# Patient Record
Sex: Male | Born: 1975 | Hispanic: Yes | Marital: Single | Smoking: Current every day smoker
Health system: Southern US, Community
[De-identification: ages and names within clinical notes are randomized; demographics above are authoritative.]

## PROBLEM LIST (undated history)

## (undated) DIAGNOSIS — I10 Essential (primary) hypertension: Secondary | ICD-10-CM

---

## 2015-09-10 ENCOUNTER — Emergency Department
Admission: EM | Admit: 2015-09-10 | Discharge: 2015-09-10 | Disposition: A | Discharge status: Home / Self Care | Payer: Self-pay | Attending: Emergency Medicine | Admitting: Emergency Medicine

## 2015-09-10 ENCOUNTER — Encounter: Payer: Self-pay | Admitting: Emergency Medicine

## 2015-09-10 ENCOUNTER — Emergency Department: Payer: Self-pay

## 2015-09-10 DIAGNOSIS — M5432 Sciatica, left side: Secondary | ICD-10-CM

## 2015-09-10 DIAGNOSIS — F17200 Nicotine dependence, unspecified, uncomplicated: Secondary | ICD-10-CM | POA: Insufficient documentation

## 2015-09-10 DIAGNOSIS — M5442 Lumbago with sciatica, left side: Secondary | ICD-10-CM | POA: Insufficient documentation

## 2015-09-10 MED ORDER — HYDROCODONE-ACETAMINOPHEN 5-325 MG PO TABS: 1.0000 | ORAL_TABLET | ORAL | 0 refills | Status: DC | PRN

## 2015-09-10 MED ORDER — IBUPROFEN 800 MG PO TABS: 800.0000 mg | ORAL_TABLET | Freq: Three times a day (TID) | ORAL | 0 refills | Status: DC | PRN

## 2015-09-10 MED ORDER — CYCLOBENZAPRINE HCL 10 MG PO TABS: 10.0000 mg | ORAL_TABLET | Freq: Three times a day (TID) | ORAL | 0 refills | Status: DC | PRN

## 2015-09-10 NOTE — ED Triage Notes (Signed)
Pt c/o lower back pain that is also down left leg.  Denies loss bowel or bladder. Pain worse when standing and back is straight. C/O pain down left leg from back.

## 2015-09-10 NOTE — ED Provider Notes (Signed)
[facility] Emergency Department Provider Note  ____________________________________________  Time seen: Approximately 4:34 PM  I have reviewed the triage vital signs and the nursing notes.   HISTORY  Chief Complaint Back Pain    HPI Kurt Colon is a 40 y.o. male presents for evaluation of severe low back pain radiating down his left leg times one day. 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 Patient's concern is having a similar episode to his left leg.   History reviewed. No pertinent past medical history.  There are no active problems to display for this patient.   History reviewed. No pertinent surgical history.    Allergies Review of patient's allergies indicates no known allergies.  History reviewed. No pertinent family history.  Social History Social History  Substance Use Topics  . Smoking status: Current Every Day Smoker  . Smokeless tobacco: Not on file  . Alcohol use Not on file    Review of Systems Constitutional: No fever/chills Cardiovascular: Denies chest pain. Respiratory: Denies shortness of breath. Gastrointestinal: No abdominal pain.  No nausea, no vomiting.  No diarrhea.  No constipation. Genitourinary: Negative for dysuria. Musculoskeletal: Positive for left leg pain and low back pain. Skin: Negative for rash. Neurological: Negative for headaches, focal weakness or numbness.  10-point ROS otherwise negative.  ____________________________________________   PHYSICAL EXAM:  VITAL SIGNS: ED Triage Vitals  Enc Vitals Group     BP 09/10/15 1512 (!) 173/94     Pulse Rate 09/10/15 1512 (!) 111     Resp 09/10/15 1512 18     Temp 09/10/15 1512 97.9 F (36.6 C)     Temp Source 09/10/15 1512 Oral     SpO2 09/10/15 1512 96 %     Weight 09/10/15 1512 165 lb (74.8 kg)     Height 09/10/15 1512 5\' 7"  (1.702 m)     Head Circumference --    Peak Flow --      Pain Score 09/10/15 1513 8     Pain Loc --      Pain Edu? --      Excl. in GC? --     Constitutional: Alert and oriented. Well appearing and in no acute distress. Neck: No stridor. , Full range of motion nontender.  Cardiovascular: Normal rate, regular rhythm. Grossly normal heart sounds.  Good peripheral circulation. Respiratory: Normal respiratory effort.  No retractions. Lungs CTAB. Gastrointestinal: Soft and nontender. No distention. No CVA tenderness. Musculoskeletal: No spinal tenderness positive straight leg raise to the left leg. Distally neurovascularly intact with point tenderness noted to the left paraspinal lumbar region. Neurologic:  Normal speech and language. No gross focal neurologic deficits are appreciated. No gait instability. Skin:  Skin is warm, dry and intact. No rash noted. Psychiatric: Mood and affect are normal. Speech and behavior are normal.  ____________________________________________   LABS (all labs ordered are listed, but only abnormal results are displayed)  Labs Reviewed - No data to display ____________________________________________  EKG   ____________________________________________  RADIOLOGY  IMPRESSION: Left foraminal protrusion at L3-4 appears to impinge on the exiting left L3 root. Disc and endplate spur at L5-S1 cause moderate to moderately severe bilateral foraminal narrowing. Disc contacts the S1 roots in the lateral recesses although the roots do not appear compressed. Atherosclerosis. ____________________________________________   PROCEDURES  Procedure(s) performed: None  Critical Care performed: No  ____________________________________________   INITIAL IMPRESSION / ASSESSMENT AND PLAN /  ED COURSE  Pertinent labs & imaging results that were available during my care of the patient were reviewed by me and considered in my medical decision making (see chart for details).  Acute exacerbation of  sciatica left leg. Rx given for Flexeril 10 mg 3 times a day, ibuprofen 3 times a day, Norco 5/325. Patient follow-up PCP or return ER with any worsening symptomology.  Clinical Course    ____________________________________________   FINAL CLINICAL IMPRESSION(S) / ED DIAGNOSES  Final diagnoses:  Sciatica of left side     This chart was dictated using voice recognition software/Dragon. Despite best efforts to proofread, errors can occur which can change the meaning. Any change was purely unintentional.    Evangeline Dakin, PA-C 09/10/15 1731    Sharman Cheek, MD 09/13/15 [phone]

## 2018-04-14 ENCOUNTER — Encounter: Payer: Self-pay | Admitting: Emergency Medicine

## 2018-04-14 ENCOUNTER — Emergency Department: Payer: Self-pay

## 2018-04-14 ENCOUNTER — Emergency Department
Admission: EM | Admit: 2018-04-14 | Discharge: 2018-04-14 | Disposition: A | Discharge status: Home / Self Care | Payer: Self-pay | Attending: Emergency Medicine | Admitting: Emergency Medicine

## 2018-04-14 ENCOUNTER — Other Ambulatory Visit: Payer: Self-pay

## 2018-04-14 DIAGNOSIS — S93402A Sprain of unspecified ligament of left ankle, initial encounter: Secondary | ICD-10-CM | POA: Insufficient documentation

## 2018-04-14 DIAGNOSIS — M25472 Effusion, left ankle: Secondary | ICD-10-CM

## 2018-04-14 DIAGNOSIS — Y9301 Activity, walking, marching and hiking: Secondary | ICD-10-CM | POA: Insufficient documentation

## 2018-04-14 DIAGNOSIS — Y999 Unspecified external cause status: Secondary | ICD-10-CM | POA: Insufficient documentation

## 2018-04-14 DIAGNOSIS — I10 Essential (primary) hypertension: Secondary | ICD-10-CM | POA: Insufficient documentation

## 2018-04-14 DIAGNOSIS — W0110XA Fall on same level from slipping, tripping and stumbling with subsequent striking against unspecified object, initial encounter: Secondary | ICD-10-CM | POA: Insufficient documentation

## 2018-04-14 DIAGNOSIS — F17200 Nicotine dependence, unspecified, uncomplicated: Secondary | ICD-10-CM | POA: Insufficient documentation

## 2018-04-14 DIAGNOSIS — Y929 Unspecified place or not applicable: Secondary | ICD-10-CM | POA: Insufficient documentation

## 2018-04-14 HISTORY — DX: Essential (primary) hypertension: I10

## 2018-04-14 MED ORDER — MELOXICAM 15 MG PO TABS: 15.0000 mg | ORAL_TABLET | Freq: Every day | ORAL | 0 refills | Status: AC

## 2018-04-14 NOTE — ED Triage Notes (Signed)
Pt c/o LFT ankle pain after injury xfew days ago. + swelling, no deformity noted
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
## 2018-04-14 NOTE — ED Notes (Signed)
See triage note  Stats  He twisted his left ankle last Thursday  Has been able to bear some wt  Positive swelling noted  No deformity noted  Good pulses

## 2019-07-20 ENCOUNTER — Emergency Department: Payer: Self-pay

## 2019-07-20 ENCOUNTER — Emergency Department
Admission: EM | Admit: 2019-07-20 | Discharge: 2019-07-20 | Disposition: A | Discharge status: Home / Self Care | Payer: Self-pay | Attending: Emergency Medicine | Admitting: Emergency Medicine

## 2019-07-20 ENCOUNTER — Other Ambulatory Visit: Payer: Self-pay

## 2019-07-20 DIAGNOSIS — S01411A Laceration without foreign body of right cheek and temporomandibular area, initial encounter: Secondary | ICD-10-CM | POA: Insufficient documentation

## 2019-07-20 DIAGNOSIS — Y9389 Activity, other specified: Secondary | ICD-10-CM | POA: Insufficient documentation

## 2019-07-20 DIAGNOSIS — S60511A Abrasion of right hand, initial encounter: Secondary | ICD-10-CM | POA: Insufficient documentation

## 2019-07-20 DIAGNOSIS — S0181XA Laceration without foreign body of other part of head, initial encounter: Secondary | ICD-10-CM | POA: Insufficient documentation

## 2019-07-20 DIAGNOSIS — Z23 Encounter for immunization: Secondary | ICD-10-CM | POA: Insufficient documentation

## 2019-07-20 DIAGNOSIS — F17200 Nicotine dependence, unspecified, uncomplicated: Secondary | ICD-10-CM | POA: Insufficient documentation

## 2019-07-20 DIAGNOSIS — T07XXXA Unspecified multiple injuries, initial encounter: Secondary | ICD-10-CM

## 2019-07-20 DIAGNOSIS — S80211A Abrasion, right knee, initial encounter: Secondary | ICD-10-CM | POA: Insufficient documentation

## 2019-07-20 DIAGNOSIS — Y999 Unspecified external cause status: Secondary | ICD-10-CM | POA: Insufficient documentation

## 2019-07-20 DIAGNOSIS — S80212A Abrasion, left knee, initial encounter: Secondary | ICD-10-CM | POA: Insufficient documentation

## 2019-07-20 DIAGNOSIS — S60512A Abrasion of left hand, initial encounter: Secondary | ICD-10-CM | POA: Insufficient documentation

## 2019-07-20 DIAGNOSIS — Y92524 Gas station as the place of occurrence of the external cause: Secondary | ICD-10-CM | POA: Insufficient documentation

## 2019-07-20 MED ORDER — CYCLOBENZAPRINE HCL 5 MG PO TABS: 5.0000 mg | ORAL_TABLET | Freq: Three times a day (TID) | ORAL | 0 refills | Status: AC | PRN

## 2019-07-20 MED ORDER — AMOXICILLIN-POT CLAVULANATE 875-125 MG PO TABS
1.0000 | ORAL_TABLET | Freq: Once | ORAL | Status: AC
  Administered 2019-07-20: 1 via ORAL
  Filled 2019-07-20: qty 1

## 2019-07-20 MED ORDER — BACITRACIN-NEOMYCIN-POLYMYXIN 400-5-5000 EX OINT
TOPICAL_OINTMENT | Freq: Once | CUTANEOUS | Status: AC
  Filled 2019-07-20: qty 3

## 2019-07-20 MED ORDER — AMOXICILLIN-POT CLAVULANATE 875-125 MG PO TABS: 1.0000 | ORAL_TABLET | Freq: Two times a day (BID) | ORAL | 0 refills | Status: AC

## 2019-07-20 MED ORDER — LIDOCAINE-EPINEPHRINE 1 %-1:100000 IJ SOLN
30.0000 mL | Freq: Once | INTRAMUSCULAR | Status: DC
  Filled 2019-07-20: qty 1

## 2019-07-20 MED ORDER — TETANUS-DIPHTH-ACELL PERTUSSIS 5-2.5-18.5 LF-MCG/0.5 IM SUSP
0.5000 mL | Freq: Once | INTRAMUSCULAR | Status: AC
  Administered 2019-07-20: 0.5 mL via INTRAMUSCULAR
  Filled 2019-07-20: qty 0.5
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
## 2019-07-20 NOTE — Discharge Instructions (Addendum)
Keep the wounds clean, dry, and covered with antibiotic ointment. Apply ice to reduce swelling. Follow-up with your provider for suture removal, in 3-7 days. Take the antibiotics as directed. Return for any signs of infection.
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
